# Patient Record
Sex: Female | Born: 1990 | Race: Black or African American | Hispanic: No | Marital: Single | Smoking: Current every day smoker
Health system: Southern US, Community
[De-identification: ages and names within clinical notes are randomized; demographics above are authoritative.]

## PROBLEM LIST (undated history)

## (undated) DIAGNOSIS — G43909 Migraine, unspecified, not intractable, without status migrainosus: Secondary | ICD-10-CM

## (undated) DIAGNOSIS — R569 Unspecified convulsions: Secondary | ICD-10-CM

## (undated) DIAGNOSIS — R51 Headache: Secondary | ICD-10-CM

## (undated) DIAGNOSIS — J45909 Unspecified asthma, uncomplicated: Secondary | ICD-10-CM

## (undated) DIAGNOSIS — H539 Unspecified visual disturbance: Secondary | ICD-10-CM

## (undated) DIAGNOSIS — L509 Urticaria, unspecified: Secondary | ICD-10-CM

## (undated) DIAGNOSIS — I639 Cerebral infarction, unspecified: Secondary | ICD-10-CM

## (undated) DIAGNOSIS — Z8679 Personal history of other diseases of the circulatory system: Secondary | ICD-10-CM

## (undated) DIAGNOSIS — L309 Dermatitis, unspecified: Secondary | ICD-10-CM

## (undated) DIAGNOSIS — I671 Cerebral aneurysm, nonruptured: Secondary | ICD-10-CM

## (undated) DIAGNOSIS — R519 Headache, unspecified: Secondary | ICD-10-CM

## (undated) DIAGNOSIS — Z9889 Other specified postprocedural states: Secondary | ICD-10-CM

## (undated) HISTORY — PX: HERNIA REPAIR: SHX51

## (undated) HISTORY — DX: Unspecified visual disturbance: H53.9

## (undated) HISTORY — DX: Dermatitis, unspecified: L30.9

## (undated) HISTORY — DX: Cerebral infarction, unspecified: I63.9

## (undated) HISTORY — DX: Migraine, unspecified, not intractable, without status migrainosus: G43.909

## (undated) HISTORY — DX: Headache, unspecified: R51.9

## (undated) HISTORY — DX: Urticaria, unspecified: L50.9

## (undated) HISTORY — PX: CHOLECYSTECTOMY: SHX55

## (undated) HISTORY — DX: Unspecified asthma, uncomplicated: J45.909

## (undated) HISTORY — DX: Headache: R51

## (undated) HISTORY — DX: Unspecified convulsions: R56.9

## (undated) HISTORY — DX: Personal history of other diseases of the circulatory system: Z86.79

## (undated) HISTORY — DX: Other specified postprocedural states: Z98.890

## (undated) HISTORY — DX: Cerebral aneurysm, nonruptured: I67.1

---

## 2011-03-20 HISTORY — PX: TUBAL LIGATION: SHX77

## 2017-04-08 ENCOUNTER — Encounter: Payer: Self-pay | Admitting: Neurology

## 2017-04-08 ENCOUNTER — Telehealth (HOSPITAL_COMMUNITY): Payer: Self-pay | Admitting: Radiology

## 2017-04-08 ENCOUNTER — Encounter (INDEPENDENT_AMBULATORY_CARE_PROVIDER_SITE_OTHER): Payer: Self-pay

## 2017-04-08 ENCOUNTER — Ambulatory Visit: Payer: Medicaid Other | Admitting: Neurology

## 2017-04-08 VITALS — BP 117/77 | HR 61 | Ht 67.0 in | Wt 159.0 lb

## 2017-04-08 DIAGNOSIS — G43911 Migraine, unspecified, intractable, with status migrainosus: Secondary | ICD-10-CM | POA: Diagnosis not present

## 2017-04-08 DIAGNOSIS — G253 Myoclonus: Secondary | ICD-10-CM

## 2017-04-08 DIAGNOSIS — Z9889 Other specified postprocedural states: Secondary | ICD-10-CM

## 2017-04-08 DIAGNOSIS — Z8679 Personal history of other diseases of the circulatory system: Secondary | ICD-10-CM

## 2017-04-08 DIAGNOSIS — I671 Cerebral aneurysm, nonruptured: Secondary | ICD-10-CM | POA: Diagnosis not present

## 2017-04-08 DIAGNOSIS — G43909 Migraine, unspecified, not intractable, without status migrainosus: Secondary | ICD-10-CM

## 2017-04-08 HISTORY — DX: Migraine, unspecified, not intractable, without status migrainosus: G43.909

## 2017-04-08 HISTORY — DX: Personal history of other diseases of the circulatory system: Z98.890

## 2017-04-08 HISTORY — DX: Personal history of other diseases of the circulatory system: Z86.79

## 2017-04-08 MED ORDER — TOPIRAMATE 25 MG PO TABS: 50.0000 mg | ORAL_TABLET | Freq: Two times a day (BID) | ORAL | 3 refills | Status: DC

## 2017-04-08 MED ORDER — FOLIC ACID 1 MG PO TABS: 1.0000 mg | ORAL_TABLET | Freq: Every day | ORAL | 3 refills | Status: DC

## 2017-04-08 MED ORDER — RIZATRIPTAN BENZOATE 10 MG PO TABS: 10.0000 mg | ORAL_TABLET | Freq: Three times a day (TID) | ORAL | 3 refills | Status: AC | PRN

## 2017-04-08 NOTE — Patient Instructions (Signed)
   We will start Topamax for the headache.   We will get CT angiogram and get follow up with radiology, Dr. Corliss Skainseveshwar.  Topamax (topiramate) is a seizure medication that has an FDA approval for seizures and for migraine headache. Potential side effects of this medication include weight loss, cognitive slowing, tingling in the fingers and toes, and carbonated drinks will taste bad. If any significant side effects are noted on this drug, please contact our office.

## 2017-04-08 NOTE — Progress Notes (Signed)
Reason for visit: Headache, seizures  Referring physician: Dr. Knight  Leah Werner is a 27 y.o. female  History of present illness:  Ms. 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  This occurred in [place], at that time the aneurysm was coiled and the patient recovered fairly well.  She initially had some problems with balance with ambulation, but she has done quite well in the long run.  The patient went back for a cerebral angiogram 6 months after the aneurysm coiling, but she has not followed up otherwise.  The patient has moved to this area in April 2018.  The patient has had headaches since the cerebral aneurysm that may last 2-3 days at a time, she may have only 4 or 5 headache free days a month.  The headaches are in the frontal area, and may be associated with some nausea and occasional vomiting.  The patient indicates that sleeping will help the headache, she also takes Tylenol which does not help much.  The patient indicates that bright lights may bring on headaches.  The patient may have some cognitive clouding with the headaches.  Prior to the onset of the headache she gets occasional episodes of myoclonus affecting the legs, one side or the other.  The patient then may have rapid side to side eye movements.  These events last anywhere from 1 or 2 minutes to up to 7 minutes.  The headache will then follow.  The patient currently is not working, she is not on disability.  She does not operate a motor vehicle.  The patient in the past has been on Keppra, she indicates that it did help the jerking episodes.  The Keppra did not help the headache.  The patient has 7 brothers and 3 sisters, there is no family history of seizures or headache.  The patient reports that she did not have headaches prior to the cerebral aneurysm rupture.  She does not have any residual numbness or weakness of the face, arms, or legs.  She denies  any difficulty controlling the bowels or the bladder.  She currently is on no medications.  She comes into this office for an evaluation.  She just had blood work done on 04 April 2017, renal function was checked, she does not know the results of the blood work.  Past Medical History:  Diagnosis Date  . Cerebral aneurysm   . Headache   . History of cerebral aneurysm repair 04/08/2017  . Migraine headache 04/08/2017  . Seizures (HCC)   . Stroke (HCC)   . Vision abnormalities     Past Surgical History:  Procedure Laterality Date  . CHOLECYSTECTOMY    . HERNIA REPAIR    . TUBAL LIGATION  2013    Family History  Problem Relation Age of Onset  . Hypertension Mother   . Bipolar disorder Father   . Breast cancer Maternal Grandmother     Social history:  reports that she has been smoking cigarettes.  She has been smoking about 1.00 pack per day. she has never used smokeless tobacco. She reports that she drinks alcohol. She reports that she does not use drugs.  Medications:  Prior to Admission medications   Not on File      Allergies  Allergen Reactions  . Fruit & Vegetable Daily [Nutritional Supplements]     Fresh fruit and vegetable  . Latex     ROS:  Out  of a complete 14 system review of symptoms, the patient complains only of the following symptoms, and all other reviewed systems are negative.  Blurred vision, double vision Headache  Blood pressure 117/77, pulse 61, height 5\' 7"  (1.702 m), weight 159 lb (72.1 kg).  Physical Exam  General: The patient is alert and cooperative at the time of the examination.  Eyes: Pupils are equal, round, and reactive to light. Discs are flat bilaterally.  Neck: The neck is supple, no carotid bruits are noted.  Respiratory: The respiratory examination is clear.  Cardiovascular: The cardiovascular examination reveals a regular rate and rhythm, no obvious murmurs or rubs are noted.  Skin: Extremities are without significant  edema.  Neurologic Exam  Mental status: The patient is alert and oriented x 3 at the time of the examination. The patient has apparent normal recent and remote memory, with an apparently normal attention span and concentration ability.  Cranial nerves: Facial symmetry is present. There is good sensation of the face to pinprick and soft touch bilaterally. The strength of the facial muscles and the muscles to head turning and shoulder shrug are normal bilaterally. Speech is well enunciated, no aphasia or dysarthria is noted. Extraocular movements are full. Visual fields are full. The tongue is midline, and the patient has symmetric elevation of the soft palate. No obvious hearing deficits are noted.  Motor: The motor testing reveals 5 over 5 strength of all 4 extremities. Good symmetric motor tone is noted throughout.  Sensory: Sensory testing is intact to pinprick, soft touch, vibration sensation, and position sense on all 4 extremities. No evidence of extinction is noted.  Coordination: Cerebellar testing reveals good finger-nose-finger and heel-to-shin bilaterally.  Gait and station: Gait is normal. Tandem gait is normal. Romberg is negative. No drift is seen.  Reflexes: Deep tendon reflexes are symmetric, but are depressed bilaterally. Toes are downgoing bilaterally.    CT head 01/30/17:  IMPRESSION: No evidence of intracranial hemorrhage or other acute intracranial abnormality.  Vascular coils at the skullbase. If further evaluation of the brain and brain vasculature is desired, MRI/MRA may be considered.   Assessment/Plan:  1.  History of cerebral aneurysm, status post coiling procedure  2.  Headaches, probable migraine  3.  Episodes of myoclonus and horizontal nystagmus, questionable seizures  The patient has had headaches and episodes of myoclonus since the cerebral aneurysm rupture.  The patient will have the myoclonus and what sounds like horizontal nystagmus prior to  onset of the headache.  The headache has features of migraine.  The patient may not have seizures, she may have an aura to the migraine.  She only has for 5 days a month without headache.  She will be started on Topamax, 25 mg twice daily for 2 weeks and then go to 50 mg twice daily.  She will call for any dose adjustments.  Maxalt will be given for the headache.  The patient will be set up for a CT angiogram of the head.  The patient will be sent for consultation through Dr. Corliss Skains for follow-up following the cerebral aneurysm.  The patient will follow-up in 3 months.  The patient was also given a prescription for folic acid taking 1 mg daily.  She is not on any birth control.  Marlan Palau MD 04/08/2017 8:08 AM  [facility] [address] [place], [place] [address]  Phone [phone] Fax [phone]

## 2017-04-08 NOTE — Telephone Encounter (Signed)
Called pt, left VM for her to call to schedule visit with Deveshwar to follow past treated aneurysm. JM

## 2017-04-11 ENCOUNTER — Other Ambulatory Visit (HOSPITAL_COMMUNITY): Payer: Self-pay | Admitting: Interventional Radiology

## 2017-04-11 DIAGNOSIS — Z9889 Other specified postprocedural states: Secondary | ICD-10-CM

## 2017-04-17 ENCOUNTER — Ambulatory Visit (HOSPITAL_COMMUNITY): Admission: RE | Admit: 2017-04-17 | Payer: Self-pay | Source: Ambulatory Visit

## 2017-04-17 ENCOUNTER — Other Ambulatory Visit: Payer: Medicaid Other

## 2017-04-17 ENCOUNTER — Telehealth: Payer: Self-pay | Admitting: Neurology

## 2017-04-17 NOTE — Telephone Encounter (Signed)
Leah Werner called that she was at the office for her EEG but the office was closed   Her phone is [phone].  Please call her back to reschedule

## 2017-04-19 ENCOUNTER — Telehealth: Payer: Self-pay | Admitting: Neurology

## 2017-04-19 MED ORDER — NORTRIPTYLINE HCL 10 MG PO CAPS: ORAL_CAPSULE | ORAL | 3 refills | Status: DC

## 2017-04-19 NOTE — Telephone Encounter (Signed)
I called the patient.  The patient was having stomach cramps on the Topamax.  The primary doctor found that the pH of the urine was 8, but this is an expected change in the urine with any carbonic anhydrase inhibitor.  The medication will be discontinued as the patient could not tolerate the drug, not because of the changes in the urine.  We will start nortriptyline at night, the patient will contact our office if she has trouble tolerating this as well.

## 2017-04-19 NOTE — Telephone Encounter (Signed)
French Anaracy Knight-NP/[facility] [phone] called said the pt increased topiramate (TOPAMAX) 25 MG tablet to 50mg  yesterday as directed by Dr Anne HahnWillis. She felt "funny" and was having abdominal pain. She was seen at PCP office and NP did urine and PH was 8. She also did metabolic panel and advise the pt to hold off on topiramate (TOPAMAX) 25 MG tablet until further notice. She will fax labs and OV to [phone]. She is asking to please call the pt to advise today.

## 2017-04-24 ENCOUNTER — Ambulatory Visit (HOSPITAL_COMMUNITY)
Admission: RE | Admit: 2017-04-24 | Discharge: 2017-04-24 | Disposition: A | Discharge status: Home / Self Care | Payer: Medicaid Other | Source: Ambulatory Visit | Attending: Interventional Radiology | Admitting: Interventional Radiology

## 2017-04-24 ENCOUNTER — Other Ambulatory Visit (HOSPITAL_COMMUNITY): Payer: Self-pay | Admitting: Interventional Radiology

## 2017-04-24 DIAGNOSIS — Z9889 Other specified postprocedural states: Secondary | ICD-10-CM

## 2017-04-24 DIAGNOSIS — R51 Headache: Secondary | ICD-10-CM

## 2017-04-24 DIAGNOSIS — R519 Headache, unspecified: Secondary | ICD-10-CM

## 2017-04-24 HISTORY — PX: IR RADIOLOGIST EVAL & MGMT: IMG5224

## 2017-04-25 ENCOUNTER — Encounter (HOSPITAL_COMMUNITY): Payer: Self-pay | Admitting: Interventional Radiology

## 2017-04-25 ENCOUNTER — Telehealth: Payer: Self-pay | Admitting: Neurology

## 2017-04-25 ENCOUNTER — Ambulatory Visit (INDEPENDENT_AMBULATORY_CARE_PROVIDER_SITE_OTHER): Payer: Medicaid Other | Admitting: Neurology

## 2017-04-25 DIAGNOSIS — G253 Myoclonus: Secondary | ICD-10-CM

## 2017-04-25 DIAGNOSIS — R2990 Unspecified symptoms and signs involving the nervous system: Secondary | ICD-10-CM | POA: Diagnosis not present

## 2017-04-25 NOTE — Procedures (Signed)
    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  The aneurysm has been coiled.  The patient does have intermittent headaches, prior to the headaches she may get myoclonus affecting the legs, one side or the other.  The patient has been on Keppra previously and indicated that this did help the jerking episodes.  The patient is being evaluated for these events.  This is a routine EEG.  No skull defects are noted.  Medications include nortriptyline, folic acid and Maxalt.  EEG classification: Normal awake and asleep  Description of the recording: The background rhythms of this recording consists of a fairly well modulated medium amplitude background activity of 9 Hz. As the record progresses, the patient initially is in the waking state, but appears to enter the early stage II sleep during the recording, with rudimentary sleep spindles and vertex sharp wave activity seen. During the wakeful state, photic stimulation is performed, and this results in a bilateral and symmetric photic driving response. Hyperventilation was also performed, and this results in a minimal buildup of the background rhythm activities without significant slowing seen. At no time during the recording does there appear to be evidence of spike or spike wave discharges or evidence of focal slowing. EKG monitor shows no evidence of cardiac rhythm abnormalities with a heart rate of 66.  Impression: This is a normal EEG recording in the waking and sleeping state. No evidence of ictal or interictal discharges were seen at any time during the recording.

## 2017-04-25 NOTE — Telephone Encounter (Signed)
I called the patient.  EEG study done was unremarkable, no evidence of irritability seen.

## 2017-05-01 ENCOUNTER — Other Ambulatory Visit: Payer: Self-pay | Admitting: Radiology

## 2017-05-01 ENCOUNTER — Other Ambulatory Visit: Payer: Self-pay | Admitting: General Surgery

## 2017-05-02 ENCOUNTER — Ambulatory Visit (HOSPITAL_COMMUNITY): Admission: RE | Admit: 2017-05-02 | Payer: Medicaid Other | Source: Ambulatory Visit

## 2017-05-17 ENCOUNTER — Other Ambulatory Visit: Payer: Self-pay | Admitting: Radiology

## 2017-05-20 ENCOUNTER — Telehealth (HOSPITAL_COMMUNITY): Payer: Self-pay | Admitting: *Deleted

## 2017-05-20 ENCOUNTER — Other Ambulatory Visit: Payer: Self-pay | Admitting: Radiology

## 2017-05-20 NOTE — Telephone Encounter (Signed)
Left message on patients mothers phone to have patietn call us at [phone] as soon as possible

## 2017-05-20 NOTE — Telephone Encounter (Signed)
Called and left message on patients cell phone that we need to cancel his procedure for tomorrow.  We will call tomorrow to reschedule.  Left call back number for patient to call.

## 2017-05-21 ENCOUNTER — Ambulatory Visit (HOSPITAL_COMMUNITY): Admission: RE | Admit: 2017-05-21 | Payer: Medicaid Other | Source: Ambulatory Visit

## 2017-05-21 ENCOUNTER — Encounter (HOSPITAL_COMMUNITY): Payer: Self-pay

## 2017-06-03 ENCOUNTER — Other Ambulatory Visit: Payer: Self-pay | Admitting: Student

## 2017-06-04 ENCOUNTER — Other Ambulatory Visit (HOSPITAL_COMMUNITY): Payer: Self-pay | Admitting: Interventional Radiology

## 2017-06-04 ENCOUNTER — Encounter (HOSPITAL_COMMUNITY): Payer: Self-pay

## 2017-06-04 ENCOUNTER — Ambulatory Visit (HOSPITAL_COMMUNITY)
Admission: RE | Admit: 2017-06-04 | Discharge: 2017-06-04 | Disposition: A | Discharge status: Home / Self Care | Payer: Medicaid Other | Source: Ambulatory Visit | Attending: Interventional Radiology | Admitting: Interventional Radiology

## 2017-06-04 DIAGNOSIS — F17210 Nicotine dependence, cigarettes, uncomplicated: Secondary | ICD-10-CM | POA: Diagnosis not present

## 2017-06-04 DIAGNOSIS — R51 Headache: Secondary | ICD-10-CM | POA: Diagnosis present

## 2017-06-04 DIAGNOSIS — Z9889 Other specified postprocedural states: Secondary | ICD-10-CM

## 2017-06-04 DIAGNOSIS — Z91040 Latex allergy status: Secondary | ICD-10-CM | POA: Insufficient documentation

## 2017-06-04 DIAGNOSIS — Z8774 Personal history of (corrected) congenital malformations of heart and circulatory system: Secondary | ICD-10-CM | POA: Insufficient documentation

## 2017-06-04 DIAGNOSIS — R519 Headache, unspecified: Secondary | ICD-10-CM

## 2017-06-04 HISTORY — PX: IR ANGIO INTRA EXTRACRAN SEL COM CAROTID INNOMINATE BILAT MOD SED: IMG5360

## 2017-06-04 HISTORY — PX: IR ANGIO VERTEBRAL SEL VERTEBRAL BILAT MOD SED: IMG5369

## 2017-06-04 LAB — BASIC METABOLIC PANEL
ANION GAP: 12 (ref 5–15)
BUN: 11 mg/dL (ref 6–20)
CHLORIDE: 102 mmol/L (ref 101–111)
CO2: 23 mmol/L (ref 22–32)
Calcium: 9.3 mg/dL (ref 8.9–10.3)
Creatinine, Ser: 0.83 mg/dL (ref 0.44–1.00)
GFR calc Af Amer: >60 mL/min (ref >60)
Glucose, Bld: 92 mg/dL (ref 65–99)
Potassium: 3.3 mmol/L — ABNORMAL LOW (ref 3.5–5.1)
Sodium: 137 mmol/L (ref 135–145)

## 2017-06-04 LAB — CBC
HEMATOCRIT: 38.6 % (ref 36.0–46.0)
HEMOGLOBIN: 13 g/dL (ref 12.0–15.0)
MCH: 29.4 pg (ref 26.0–34.0)
MCHC: 33.7 g/dL (ref 30.0–36.0)
MCV: 87.3 fL (ref 78.0–100.0)
Platelets: 261 10*3/uL (ref 150–400)
RBC: 4.42 MIL/uL (ref 3.87–5.11)
RDW: 15 % (ref 11.5–15.5)
WBC: 7.8 10*3/uL (ref 4.0–10.5)

## 2017-06-04 LAB — PROTIME-INR
INR: 0.99
Prothrombin Time: 13 seconds (ref 11.4–15.2)

## 2017-06-04 LAB — PREGNANCY, URINE: Preg Test, Ur: NEGATIVE

## 2017-06-04 MED ORDER — ACETAMINOPHEN 325 MG PO TABS
650.0000 mg | ORAL_TABLET | Freq: Four times a day (QID) | ORAL | Status: DC | PRN
  Administered 2017-06-04: 650 mg via ORAL

## 2017-06-04 MED ORDER — FENTANYL CITRATE (PF) 100 MCG/2ML IJ SOLN
INTRAMUSCULAR | Status: AC | PRN
  Administered 2017-06-04 (×2): 25 ug via INTRAVENOUS

## 2017-06-04 MED ORDER — SODIUM CHLORIDE 0.9 % IV SOLN
INTRAVENOUS | Status: AC | PRN
  Administered 2017-06-04: 250 mL via INTRAVENOUS

## 2017-06-04 MED ORDER — SODIUM CHLORIDE 0.9 % IV SOLN: INTRAVENOUS | Status: DC

## 2017-06-04 MED ORDER — FENTANYL CITRATE (PF) 100 MCG/2ML IJ SOLN
INTRAMUSCULAR | Status: AC
  Filled 2017-06-04: qty 2

## 2017-06-04 MED ORDER — IOPAMIDOL (ISOVUE-300) INJECTION 61%
INTRAVENOUS | Status: AC
  Filled 2017-06-04: qty 50

## 2017-06-04 MED ORDER — LIDOCAINE HCL 1 % IJ SOLN
INTRAMUSCULAR | Status: AC
  Filled 2017-06-04: qty 20

## 2017-06-04 MED ORDER — LIDOCAINE HCL (PF) 1 % IJ SOLN
INTRAMUSCULAR | Status: AC | PRN
  Administered 2017-06-04: 10 mL

## 2017-06-04 MED ORDER — HEPARIN SODIUM (PORCINE) 1000 UNIT/ML IJ SOLN
INTRAMUSCULAR | Status: AC | PRN
  Administered 2017-06-04: 1000 [IU] via INTRAVENOUS

## 2017-06-04 MED ORDER — ACETAMINOPHEN 325 MG PO TABS
ORAL_TABLET | ORAL | Status: AC
  Administered 2017-06-04: 650 mg via ORAL
  Filled 2017-06-04: qty 2

## 2017-06-04 MED ORDER — SODIUM CHLORIDE 0.9 % IV SOLN
Freq: Once | INTRAVENOUS | Status: AC
  Administered 2017-06-04: 07:00:00 via INTRAVENOUS

## 2017-06-04 MED ORDER — HEPARIN SODIUM (PORCINE) 1000 UNIT/ML IJ SOLN
INTRAMUSCULAR | Status: AC
  Filled 2017-06-04: qty 2

## 2017-06-04 MED ORDER — IOPAMIDOL (ISOVUE-300) INJECTION 61%
INTRAVENOUS | Status: AC
  Administered 2017-06-04: 95 mL
  Filled 2017-06-04: qty 150

## 2017-06-04 MED ORDER — MIDAZOLAM HCL 2 MG/2ML IJ SOLN
INTRAMUSCULAR | Status: AC
  Filled 2017-06-04: qty 2

## 2017-06-04 MED ORDER — MIDAZOLAM HCL 2 MG/2ML IJ SOLN
INTRAMUSCULAR | Status: AC | PRN
  Administered 2017-06-04: 1 mg via INTRAVENOUS

## 2017-06-04 NOTE — Sedation Documentation (Signed)
Patient denies pain and is resting comfortably.  

## 2017-06-04 NOTE — Procedures (Signed)
S/P 4 vessel cerebral arteriogram. RT CFA approach  Findings. 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

## 2017-06-04 NOTE — Sedation Documentation (Signed)
Patient is resting comfortably. 

## 2017-06-04 NOTE — Sedation Documentation (Signed)
Pulses checked/right groin site assesed and verified with Shawna OrleansMelanie, RN in Short Stay.

## 2017-06-04 NOTE — Discharge Instructions (Signed)
Return to Work ___________________________________________________ was treated at our facility. 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  Employee may return to modified work on ______________________. Work activity restrictions Work activities that are not tolerated include: ___Bending ___Prolonged sitting ___Lifting more than ________ lb ___Squatting ___ Prolonged standing ___Climbing ___Reaching ___Pushing and pulling ___ Walking ___Other ______________________ These restrictions are effective until ______________________. Show this Return to Work statement to your supervisor at work as soon as possible. Your employer should be aware of your condition and can help with the necessary work activity restrictions. If you wish to return to work sooner than the date that is listed above, or if you have further problems that make it difficult for you to return at that time, please call our clinic or your health care provider. _________________________________________ Health Care Provider Name (printed) _________________________________________ Health Care Provider (signature) _________________________________________ Date This information is not intended to replace advice given to you by your health care provider. Make sure you discuss any questions you have with your health care provider. Document Released: 03/05/2005 Document Revised: 02/17/2016 Document Reviewed: 10/02/2013 Elsevier Interactive Patient Education  2018 Elsevier Inc. Moderate Conscious Sedation, Adult Sedation is the use of medicines to promote relaxation and relieve discomfort and anxiety. Moderate conscious sedation is a type of sedation. Under moderate conscious sedation, you are less alert than normal, but you are still able to respond to instructions, touch, or both. Moderate conscious  sedation is used during short medical and dental procedures. It is milder than deep sedation, which is a type of sedation under which you cannot be easily woken up. It is also milder than general anesthesia, which is the use of medicines to make you unconscious. Moderate conscious sedation allows you to return to your regular activities sooner. Tell a health care provider about:  Any allergies you have.  All medicines you are taking, including vitamins, herbs, eye drops, creams, and over-the-counter medicines.  Use of steroids (by mouth or creams).  Any problems you or family members have had with sedatives and anesthetic medicines.  Any blood disorders you have.  Any surgeries you have had.  Any medical conditions you have, such as sleep apnea.  Whether you are pregnant or may be pregnant.  Any use of cigarettes, alcohol, marijuana, or street drugs. What are the risks? Generally, this is a safe procedure. However, problems may occur, including:  Getting too much medicine (oversedation).  Nausea.  Allergic reaction to medicines.  Trouble breathing. If this happens, a breathing tube may be used to help with breathing. It will be removed when you are awake and breathing on your own.  Heart trouble.  Lung trouble.  What happens before the procedure? Staying hydrated Follow instructions from your health care provider about hydration, which may include:  Up to 2 hours before the procedure - you may continue to drink clear liquids, such as water, clear fruit juice, black coffee, and plain tea.  Eating and drinking restrictions Follow instructions from your health care provider about eating and drinking, which may include:  8 hours before the procedure - stop eating heavy meals or foods such as meat, fried foods, or fatty foods.  6 hours before the procedure - stop eating light meals or foods, such as toast or cereal.  6 hours before the procedure - stop drinking milk or  drinks that contain milk.  2 hours before the procedure -  stop drinking clear liquids.  Medicine  Ask your health care provider about:  Changing or stopping your regular medicines. This is especially important if you are taking diabetes medicines or blood thinners.  Taking medicines such as aspirin and ibuprofen. These medicines can thin your blood. Do not take these medicines before your procedure if your health care provider instructs you not to.  Tests and exams  You will have a physical exam.  You may have blood tests done to show: ? How well your kidneys and liver are working. ? How well your blood can clot. General instructions  Plan to have someone take you home from the hospital or clinic.  If you will be going home right after the procedure, plan to have someone with you for 24 hours. What happens during the procedure?  An IV tube will be inserted into one of your veins.  Medicine to help you relax (sedative) will be given through the IV tube.  The medical or dental procedure will be performed. What happens after the procedure?  Your blood pressure, heart rate, breathing rate, and blood oxygen level will be monitored often until the medicines you were given have worn off.  Do not drive for 24 hours. This information is not intended to replace advice given to you by your health care provider. Make sure you discuss any questions you have with your health care provider. Document Released: 11/28/2000 Document Revised: 08/09/2015 Document Reviewed: 06/25/2015 Elsevier Interactive Patient Education  2018 Elsevier Inc. Cerebral Angiogram, Care After Refer to this sheet in the next few weeks. These instructions provide you with information on caring for yourself after your procedure. Your health care provider may also give you more specific instructions. Your treatment has been planned according to current medical practices, but problems sometimes occur. Call your health  care provider if you have any problems or questions after your procedure. What can I expect after the procedure? After your procedure, it is typical to have the following:  Bruising at the catheter insertion site that usually fades within 1-2 weeks.  Blood collecting in the tissue (hematoma) that may be painful to the touch. It should usually decrease in size and tenderness within 1-2 weeks.  A mild headache.  Follow these instructions at home:  Take medicines only as directed by your health care provider.  You may shower 24-48 hours after the procedure or as directed by your health care provider. Remove the bandage (dressing) and gently wash the site with plain soap and water. Pat the area dry with a clean towel. Do not rub the site, because this may cause bleeding.  Do not take baths, swim, or use a hot tub until your health care provider approves.  Check your insertion site every day for redness, swelling, or drainage.  Do not apply powder or lotion to the site.  Do not lift over 10 lb (4.5 kg) for 5 days after your procedure or as directed by your health care provider.  Ask your health care provider when it is okay to: ? Return to work or school. ? Resume usual physical activities or sports. ? Resume sexual activity.  Do not drive home if you are discharged the same day as the procedure. Have someone else drive you.  You may drive 24 hours after the procedure unless otherwise instructed by your health care provider.  Do not operate machinery or power tools for 24 hours after the procedure or as directed by your health care provider.  If your procedure was done as an outpatient procedure, which means that you went home the same day as your procedure, a responsible adult should be with you for the first 24 hours after you arrive home.  Keep all follow-up visits as directed by your health care provider. This is important. Contact a health care provider if:  You have a  fever.  You have chills.  You have increased bleeding from the catheter insertion site. Hold pressure on the site. Get help right away if:  You have vision changes or loss of vision.  You have numbness or weakness on one side of your body.  You have difficulty talking, or you have slurred speech or cannot speak (aphasia).  You feel confused or have difficulty remembering.  You have unusual pain at the catheter insertion site.  You have redness, warmth, or swelling at the catheter insertion site.  You have drainage (other than a small amount of blood on the dressing) from the catheter insertion site.  The catheter insertion site is bleeding, and the bleeding does not stop after 30 minutes of holding steady pressure on the site. These symptoms may represent a serious problem that is an emergency. Do not wait to see if the symptoms will go away. Get medical help right away. Call your local emergency services (911 in [place] Do not drive yourself to the hospital. This information is not intended to replace advice given to you by your health care provider. Make sure you discuss any questions you have with your health care provider. Document Released: 07/20/2013 Document Revised: 08/11/2015 Document Reviewed: 03/18/2013 Elsevier Interactive Patient Education  2017 [employer]

## 2017-06-04 NOTE — Progress Notes (Signed)
Client c/o right groin pain 8/10 and Kasey,PA notified and in to see client

## 2017-06-04 NOTE — Progress Notes (Signed)
Per Daphene JaegerKasey,PA will give tylenol as ordered and apply warm compress to right groin per PA [facility]

## 2017-06-04 NOTE — H&P (Signed)
Chief Complaint: Patient was seen in consultation today for recurrent headaches.  Referring Physician(s): None.  Supervising Physician: Julieanne Cotton  Patient Status: [facility] - Out-pt  History of Present Illness: Leah Werner is a 27 y.o. 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 Coiled in [place], patient recovered well. No follow up after 6 month angiogram, as patient then moved to [place].  Hx headaches since cerebral aneurysm rupture. Located in frontal area, lasting up to 2-3 days, associated with nausea and occasional vomiting, aggravated by bright lights, alleviated by sleeping. Denies numbness, tingling, or weakness.  Presents today with a headache. She was last seen in Dr. Fatima Sanger clinic 04/24/17. IR requested for image-guided cerebral angiogram to evaluate stability of previously treated aneurysm and to assess for the presence of other aneurysms.  Past Medical History:  Diagnosis Date  . Cerebral aneurysm   . Headache   . History of cerebral aneurysm repair 04/08/2017  . Migraine headache 04/08/2017  . Seizures (HCC)   . Stroke (HCC)   . Vision abnormalities     Past Surgical History:  Procedure Laterality Date  . CHOLECYSTECTOMY    . HERNIA REPAIR    . IR RADIOLOGIST EVAL & MGMT  04/24/2017  . TUBAL LIGATION  2013    Allergies: Fruit & vegetable daily [nutritional supplements]; Latex; and Topamax [topiramate]  Medications: Prior to Admission medications   Medication Sig Start Date End Date Taking? Authorizing Provider  folic acid (FOLVITE) 1 MG tablet Take 1 tablet (1 mg total) by mouth daily. 04/08/17  Yes York Spaniel, MD  nortriptyline (PAMELOR) 10 MG capsule Take one capsule at night for one week, then take 2 capsules at night for one week, then take 3 capsules at night 04/19/17  Yes York Spaniel, MD  rizatriptan (MAXALT) 10 MG tablet Take 1 tablet (10 mg total) by mouth 3 (three) times daily  as needed for migraine. 04/08/17  Yes York Spaniel, MD     Family History  Problem Relation Age of Onset  . Hypertension Mother   . Bipolar disorder Father   . Breast cancer Maternal Grandmother     Social History   Socioeconomic History  . Marital status: Single    Spouse name: None  . Number of children: None  . Years of education: None  . Highest education level: None  Social Needs  . Financial resource strain: None  . Food insecurity - worry: None  . Food insecurity - inability: None  . Transportation needs - medical: None  . Transportation needs - non-medical: None  Occupational History  . None  Tobacco Use  . Smoking status: Current Every Day Smoker    Packs/day: 1.00    Types: Cigarettes  . Smokeless tobacco: Never Used  Substance and Sexual Activity  . Alcohol use: Yes    Comment: occasionally  . Drug use: No  . Sexual activity: None  Other Topics Concern  . None  Social History Narrative  . None     Review of Systems: A 12 point ROS discussed and pertinent positives are indicated in the HPI above.  All other systems are negative.  Review of Systems  Constitutional: Negative for activity change, appetite change and fever.  Respiratory: Negative for shortness of breath and wheezing.   Cardiovascular: Negative for chest pain and palpitations.  Neurological: Positive for headaches. Negative for dizziness, weakness and numbness.  Psychiatric/Behavioral: Negative for behavioral problems, confusion and decreased concentration.  Vital Signs: BP 106/69 (BP Location: Left Arm)   Pulse 67   Temp 98.1 F (36.7 C) (Oral)   Resp 16   Ht 5\' 7"  (1.702 m)   Wt 153 lb (69.4 kg)   LMP 05/27/2017   SpO2 100%   BMI 23.96 kg/m   Physical Exam  Constitutional: She is oriented to person, place, and time. She appears well-developed and well-nourished. No distress.  Cardiovascular: Normal rate, regular rhythm, normal heart sounds and intact distal pulses.  No  murmur heard. Pulmonary/Chest: Effort normal and breath sounds normal. She has no wheezes.  Neurological: She is alert and oriented to person, place, and time.  Headache without associated numbness or weakness.  Skin: Skin is warm and dry.  Psychiatric: She has a normal mood and affect. Her behavior is normal. Judgment and thought content normal.  Nursing note and vitals reviewed.    MD Evaluation Airway: WNL Heart: WNL Abdomen: WNL Chest/ Lungs: WNL ASA  Classification: 3 Mallampati/Airway Score: One   Imaging: No results found.  Labs:  CBC: Recent Labs    06/04/17 0700  WBC 7.8  HGB 13.0  HCT 38.6  PLT 261    COAGS: Recent Labs    06/04/17 0700  INR 0.99    BMP: Recent Labs    06/04/17 0700  NA 137  K 3.3*  CL 102  CO2 23  GLUCOSE 92  BUN 11  CALCIUM 9.3  CREATININE 0.83  GFRNONAA >60  GFRAA >60    LIVER FUNCTION TESTS: No results for input(s): BILITOT, AST, ALT, ALKPHOS, PROT, ALBUMIN in the last 8760 hours.  TUMOR MARKERS: No results for input(s): AFPTM, CEA, CA199, CHROMGRNA in the last 8760 hours.  Assessment and Plan:  Recurrent headaches since cerebral aneurysm rupture in 2015.  Plan for image-guided cerebral angiogram to evaluate stability of previously treated aneurysm and to assess for the presence of other aneurysms. Patient is NPO. Patient is not on blood thinners.  Risks and benefits of cerebral angiogram were discussed with the patient including, but not limited to bleeding, infection, vascular injury or contrast induced renal failure. This interventional procedure involves the use of X-rays and because of the nature of the planned procedure, it is possible that we will have prolonged use of X-ray fluoroscopy.  Potential radiation risks to you include (but are not limited to) the following: - A slightly elevated risk for cancer  several years later in life. This risk is typically less than 0.5% percent. This risk is low in  comparison to the normal incidence of human cancer, which is 33% for women and 50% for men according to the [employer]. - Radiation induced injury can include skin redness, resembling a rash, tissue breakdown / ulcers and hair loss (which can be temporary or permanent).   The likelihood of either of these occurring depends on the difficulty of the procedure and whether you are sensitive to radiation due to previous procedures, disease, or genetic conditions.  IF your procedure requires a prolonged use of radiation, you will be notified and given written instructions for further action.  It is your responsibility to monitor the irradiated area for the 2 weeks following the procedure and to notify your physician if you are concerned that you have suffered a radiation induced injury.    All of the patient's questions were answered, patient is agreeable to proceed. Consent signed and in chart.  Thank you for this interesting consult.  I greatly enjoyed meeting Leah Werner  and look forward to participating in their care.  A copy of this report was sent to the requesting provider on this date.  Electronically Signed: Elwin MochaAlexandra Charnell Peplinski, PA-C 06/04/2017, 7:53 AM   I spent a total of  30 minutes in face to face in clinical consultation, greater than 50% of which was counseling/coordinating care for recurrent headaches.

## 2017-06-04 NOTE — Sedation Documentation (Signed)
Sheath removed from right groin by Dr. Corliss Skainseveshwar. 685ft exoseal placed in right groin.

## 2017-06-04 NOTE — Sedation Documentation (Addendum)
O2/EtCO2  removed per Dr. Corliss SkainsEVESHWAR.

## 2017-06-05 ENCOUNTER — Encounter (HOSPITAL_COMMUNITY): Payer: Self-pay | Admitting: Interventional Radiology

## 2017-06-06 ENCOUNTER — Other Ambulatory Visit: Payer: Medicaid Other

## 2017-07-09 ENCOUNTER — Encounter: Payer: Self-pay | Admitting: Adult Health

## 2017-07-09 ENCOUNTER — Telehealth: Payer: Self-pay | Admitting: Neurology

## 2017-07-09 ENCOUNTER — Ambulatory Visit: Payer: Medicaid Other | Admitting: Adult Health

## 2017-07-09 NOTE — Telephone Encounter (Signed)
Pt was no show to apt today 

## 2017-08-17 ENCOUNTER — Other Ambulatory Visit: Payer: Self-pay

## 2017-08-17 ENCOUNTER — Encounter (HOSPITAL_BASED_OUTPATIENT_CLINIC_OR_DEPARTMENT_OTHER): Payer: Self-pay | Admitting: Emergency Medicine

## 2017-08-17 DIAGNOSIS — M25569 Pain in unspecified knee: Secondary | ICD-10-CM | POA: Insufficient documentation

## 2017-08-17 DIAGNOSIS — Z5321 Procedure and treatment not carried out due to patient leaving prior to being seen by health care provider: Secondary | ICD-10-CM | POA: Insufficient documentation

## 2017-08-17 NOTE — ED Triage Notes (Signed)
Patient states that she woke up this am with swelling to her knee - she reports that it is now painful from her right hip all the way down her right leg

## 2017-08-18 ENCOUNTER — Emergency Department (HOSPITAL_BASED_OUTPATIENT_CLINIC_OR_DEPARTMENT_OTHER)
Admission: EM | Admit: 2017-08-18 | Discharge: 2017-08-18 | Disposition: A | Discharge status: Home / Self Care | Payer: Medicaid Other | Attending: Emergency Medicine | Admitting: Emergency Medicine

## 2017-08-18 NOTE — ED Notes (Signed)
No answer when name called.

## 2017-08-19 NOTE — ED Notes (Signed)
Follow up call made  No answer  1135  08/19/17  s Mirakle Tomlin rn

## 2019-03-27 IMAGING — XA IR ANGIO VETEBRAL SEL VERTEBRAL BILAT MOD SED
1 series · 12 of 24 positions shown · IV contrast (IODINE)
Comparison: CT scan of the brain of 01/30/2017.

CLINICAL DATA: Persistent headaches generalized. Previous history
of ruptured anterior communicating artery region aneurysm 4 years
ago. Treated endovascularly.

EXAM:
BILATERAL COMMON CAROTID AND INNOMINATE ANGIOGRAPHY; IR ANGIO
VERTEBRAL SEL VERTEBRAL BILAT MOD SED
TECHNIQUE: Informed written consent was obtained from the patient after a
thorough discussion of the procedural risks, benefits and
alternatives. All questions were addressed. Maximal Sterile Barrier
Technique was utilized including caps, mask, sterile gowns, sterile
gloves, sterile drape, hand hygiene and skin antiseptic. A timeout
was performed prior to the initiation of the procedure.

[Series 300: dr. (person_name) · 12 of 153 slices shown]
[im 7/153]
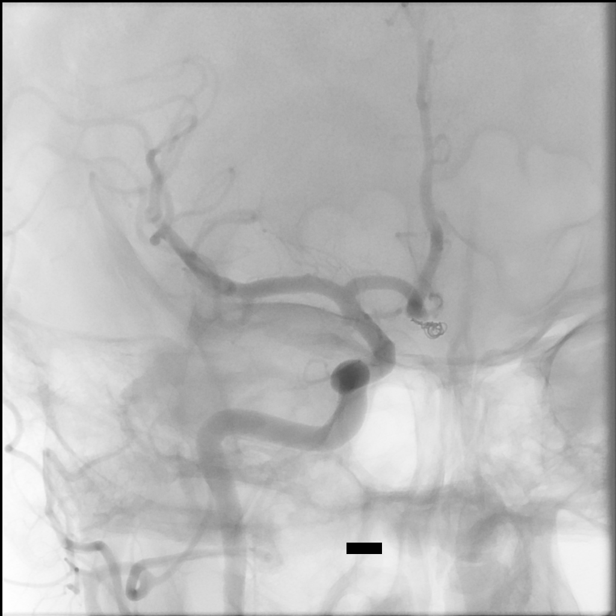
[im 20/153]
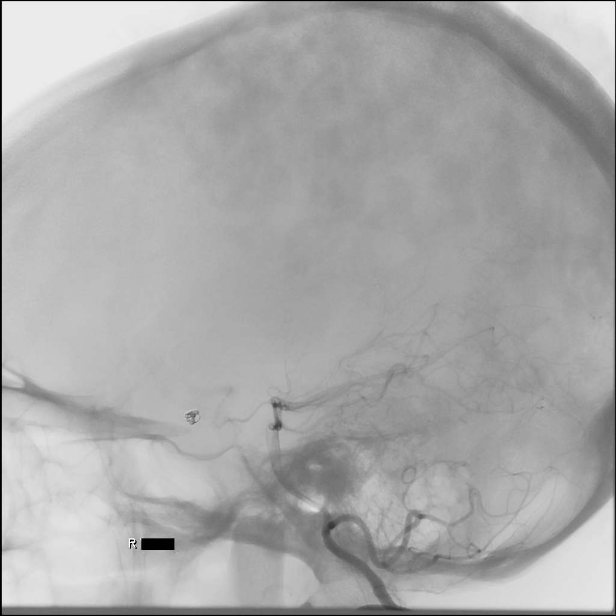
[im 34/153]
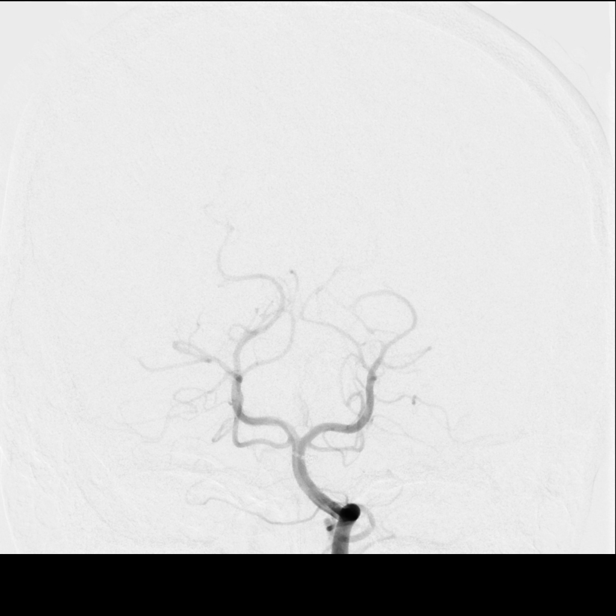
[im 47/153]
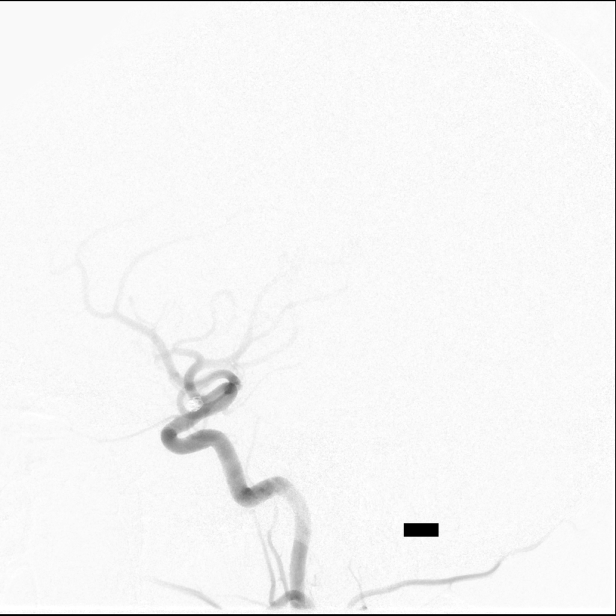
[im 60/153]
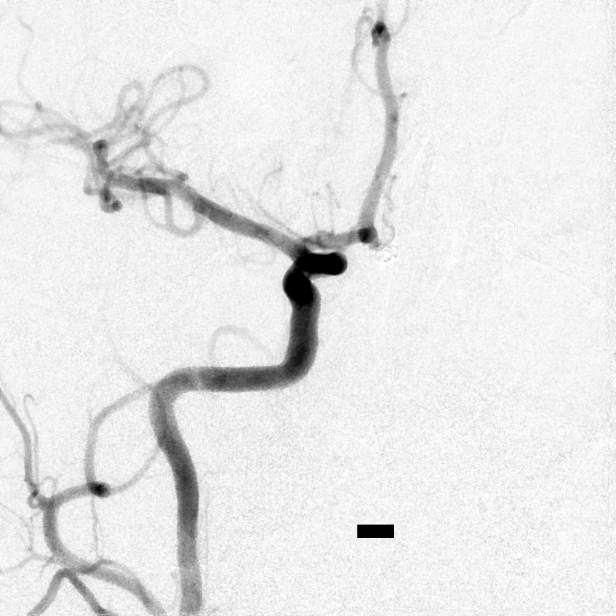
[im 73/153]
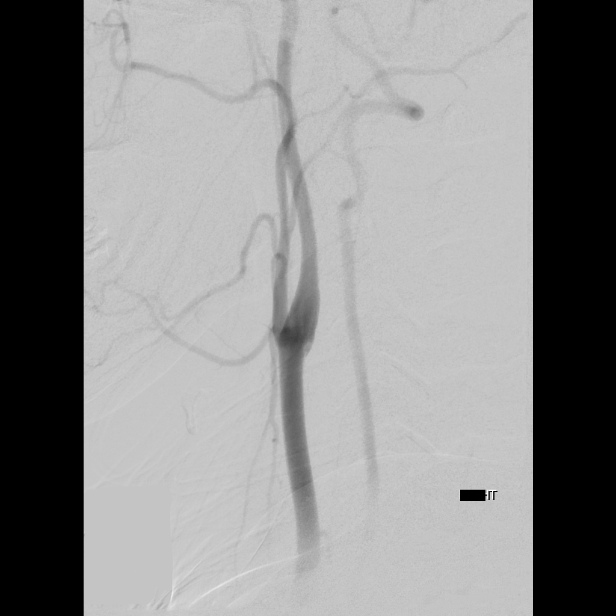
[im 86/153]
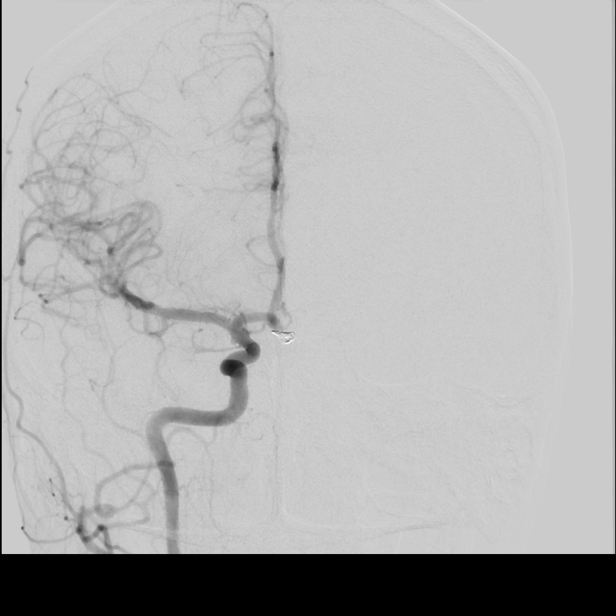
[im 100/153]
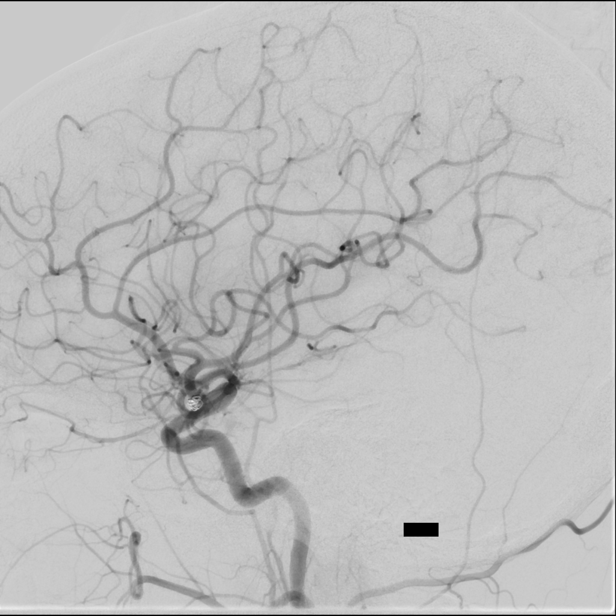
[im 113/153]
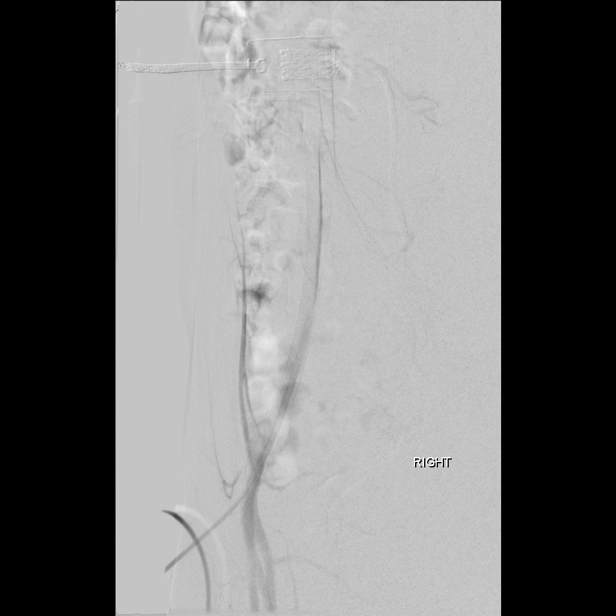
[im 126/153]
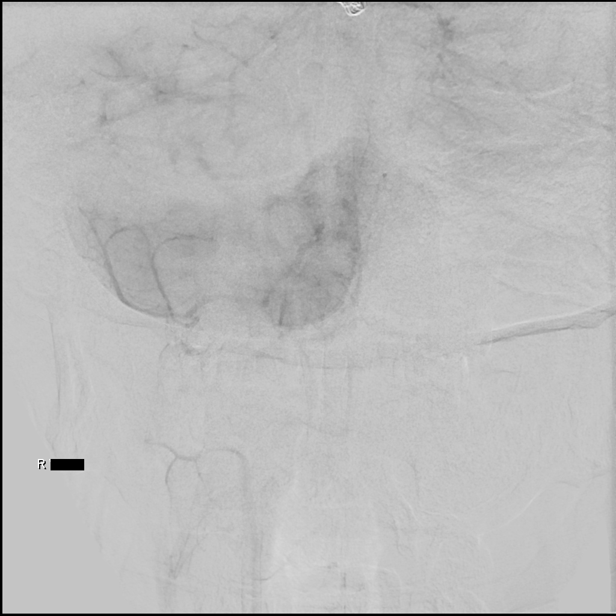
[im 139/153]
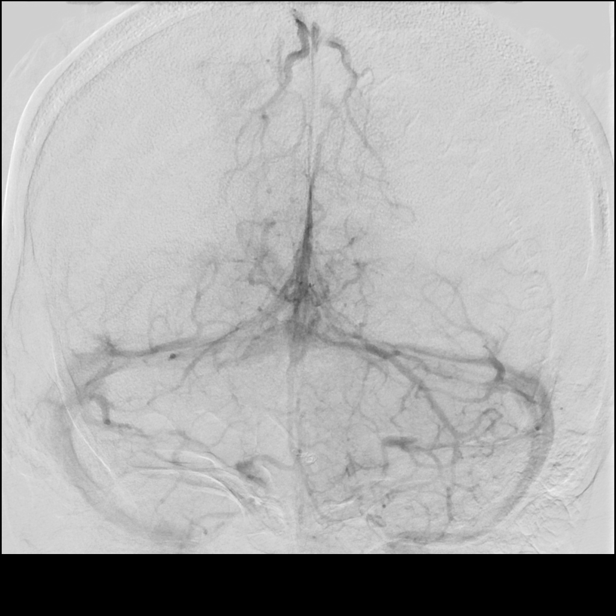
[im 153/153]
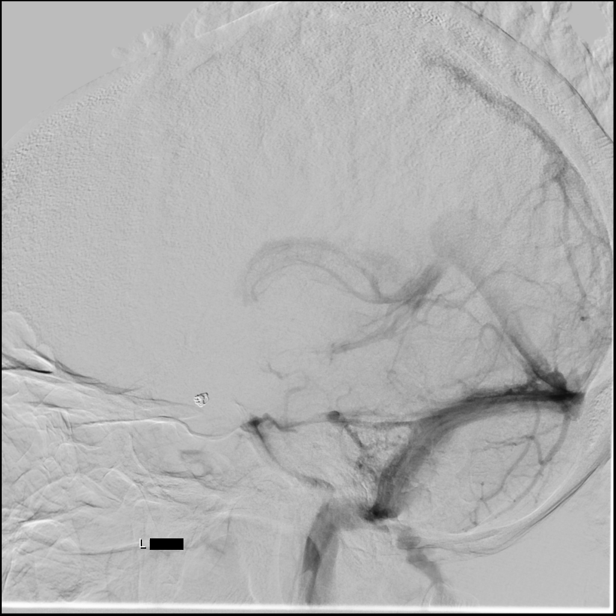

[12 of 24 positions shown; findings below may reference images not displayed]

MEDICATIONS:
Heparin 6999 units IV; no antibiotic was administered within 1 hour
of the procedure.

ANESTHESIA/SEDATION:
Versed 1 mg IV; Fentanyl 50 mcg IV.

Moderate Sedation Time:  36 minutes.

The patient was continuously monitored during the procedure by the
interventional radiology nurse under my direct supervision.

CONTRAST:  Isovue 300 approximately 60 mL.

FLUOROSCOPY TIME:  Fluoroscopy Time: 6 minutes 43 seconds (713 mGy).

COMPLICATIONS:
None immediate.
The right groin was prepped and draped in the usual sterile fashion.
Thereafter using modified Seldinger technique, transfemoral access
into the right common femoral artery was obtained without
difficulty. Over a 0.035 inch guidewire, a 5 French Pinnacle sheath
was inserted. Through this, and also over 0.035 inch guidewire, a 5
French JB 1 catheter was advanced to the aortic arch region and
selectively positioned in the right common carotid artery, , the
right vertebral artery, the left common carotid artery and the left
vertebral artery.
FINDINGS: The right common carotid arteriogram demonstrates the right external
carotid artery and its major branches to be widely patent.

The right internal carotid artery at the bulb to the cranial skull
base is widely patent.

The petrous, cavernous and the supraclinoid segments demonstrate
wide patency.

The right middle cerebral artery and the right anterior cerebral
artery opacify normally into the capillary and venous phases.

There is transient opacification via the anterior communicating
artery of the left anterior cerebral artery A2 segment.

In the anterior communicating artery region, there is a pack of
coils which appear to have undergone compaction.

Associated with this is an approximately 1.5 mm neck remnant of the
previously treated anterior communicating artery region aneurysm.

The origin of the right vertebral artery is normal. The vessel is
seen to opacify normally to the cranial skull base. There is normal
opacification of the right vertebrobasilar junction and the right
posterior-inferior cerebellar artery.

More distally the right vertebrobasilar junction opacifies normally.
Opacification of the basilar artery, the posterior cerebral
arteries, the superior cerebellar arteries and faintly of the
anterior-inferior cerebellar artery is noted. Non-opacified blood is
seen in the basilar artery from the contralateral vertebral artery.

The left common carotid arteriogram demonstrates the left external
carotid artery and its major branches to be widely patent.

The left internal carotid artery at the bulb to the cranial skull
base opacifies normally. The petrous, cavernous and supraclinoid
segments are widely patent.

The left middle cerebral artery and the left anterior cerebral
artery opacify normally into the capillary and venous phases.

The left vertebral artery origin is widely patent.

The vessel is seen to opacify normally to the cranial skull base.
Normal opacification is seen of the left vertebrobasilar junction
and the left posterior inferior cerebellar artery. The basilar
artery, the posterior cerebral arteries, the superior cerebellar
arteries and the anterior-inferior cerebellar arteries opacify
normally into the capillary and venous phases. Non-opacified blood
is seen in the proximal basilar artery from the contralateral
vertebral artery.
IMPRESSION: Approximately 1.5 mm neck remnant of the previously endovascularly
treated anterior communicating artery aneurysm with evidence of coil
compaction.

No other aneurysms identified at this time.

PLAN:
Angiographic findings were reviewed with the patient and her close
male friend.

Brought to her attention was the partially recanalized neck of the
previously treated aneurysm.

The patient was again strongly advised to stop smoking cigarettes
and as this would likely promote the worsening of the neck remnant
portion of the aneurysm with potential for regrowth. The patient
expressed understanding and wanting to stop smoking. In the
meantime, a follow-up CT angiogram will be undertaken in 6 months to
a years time. Should she developed worsening headaches, she was
advised to call promptly. The patient and her friend leave with good
understanding and agreement with the above management plan.

## 2021-02-08 ENCOUNTER — Encounter: Payer: Self-pay | Admitting: Allergy

## 2021-02-08 ENCOUNTER — Ambulatory Visit (INDEPENDENT_AMBULATORY_CARE_PROVIDER_SITE_OTHER): Payer: Commercial Managed Care - PPO | Admitting: Allergy

## 2021-02-08 ENCOUNTER — Other Ambulatory Visit: Payer: Self-pay

## 2021-02-08 VITALS — BP 110/84 | HR 56 | Temp 97.7°F | Resp 16 | Ht 66.54 in | Wt 160.2 lb

## 2021-02-08 DIAGNOSIS — H1013 Acute atopic conjunctivitis, bilateral: Secondary | ICD-10-CM | POA: Diagnosis not present

## 2021-02-08 DIAGNOSIS — J3089 Other allergic rhinitis: Secondary | ICD-10-CM | POA: Diagnosis not present

## 2021-02-08 DIAGNOSIS — T7840XA Allergy, unspecified, initial encounter: Secondary | ICD-10-CM | POA: Diagnosis not present

## 2021-02-08 DIAGNOSIS — T781XXD Other adverse food reactions, not elsewhere classified, subsequent encounter: Secondary | ICD-10-CM

## 2021-02-08 DIAGNOSIS — T7800XA Anaphylactic reaction due to unspecified food, initial encounter: Secondary | ICD-10-CM

## 2021-02-08 DIAGNOSIS — L2089 Other atopic dermatitis: Secondary | ICD-10-CM | POA: Diagnosis not present

## 2021-02-08 DIAGNOSIS — J4540 Moderate persistent asthma, uncomplicated: Secondary | ICD-10-CM | POA: Diagnosis not present

## 2021-02-08 MED ORDER — OLOPATADINE HCL 0.2 % OP SOLN: OPHTHALMIC | 5 refills | Status: AC

## 2021-02-08 MED ORDER — EPINEPHRINE 0.3 MG/0.3ML IJ SOAJ: INTRAMUSCULAR | 1 refills | Status: AC

## 2021-02-08 MED ORDER — RYALTRIS 665-25 MCG/ACT NA SUSP: 2.0000 | Freq: Two times a day (BID) | NASAL | 5 refills | Status: AC

## 2021-02-08 MED ORDER — ALBUTEROL SULFATE (2.5 MG/3ML) 0.083% IN NEBU: 2.5000 mg | INHALATION_SOLUTION | Freq: Four times a day (QID) | RESPIRATORY_TRACT | 1 refills | Status: AC | PRN

## 2021-02-08 MED ORDER — PIMECROLIMUS 1 % EX CREA: TOPICAL_CREAM | CUTANEOUS | 3 refills | Status: AC

## 2021-02-08 NOTE — Progress Notes (Signed)
New Patient Note  RE: Leah Werner MRN: [id number] DOB: October 06, 1990 Date of Office Visit: 02/08/2021  Primary care provider: Osa Craver, NP  Chief Complaint: allergies  History of present illness: Leah Werner is a 30 y.o. female presenting today for evaluation of allergic rhinitis, asthma, eczema.   She reports itchy/watery eyes, nasal congestion/drainage and sneezing are year-round.  There is a cat and dog in the home.   She is using xyzal nightly since Aug 2022.  She can't tell a difference in symptoms with use of Xyzal.  She takes hydroxyzine nightly (and sometimes during the day) for anxiety and does feel it helps with itching.  She reports taking xyzal, hydroxyzine and benadryl at night as her most recent regimen.  She states when she has used flonase it make her nose feel more swollen thus she stopped use.   Her PCP did serum IgE testing for environmental allergies and many allergens were positive (see results below).    She states since childhood she has not been able to eat fresh fruit/veggies without developing facial swelling, oral itching, hives.    She has noted this with strawberry, banana, grapes, citrus fruits, cucumber, lettuce, broccoli, carrots (most vegetable really).  She states in order to tolerate these vegetables they have to be cooked to basically mushy consistent.   Her PCP also did a food allergy panel and several foods were positive (see below).  Of the positive foods she reports after ingestion: Peanut, tree nuts - will notice throat tightening sensation and sore throat Soybean - will notice throat tightening sensation.  With edamame has noticed hives. Bread/wheat - generalized itching and dark itchy rash (worsening eczema) Corn, sesame - itchy throat, runny nose, hives Has never had any symptom with shellfish. She does not have an epinephrine device.    She has had asthma since childhood.  She states when the weather is very cold or really hot it can  trigger symptoms of shortness of breath. She states when she gets sick it also flares her asthma. She has an albuterol inhaler and takes it in the mornings now that it cooler for prevention of symptoms.  She used to have a nebulizer but no longer has a machine.  She was on Advair discus about 10 years ago.   She has not had an maintenance inhaler in that time frame.  No hospitalization.    She has history of eczema.  She has used HC, mometasone, triamcinolone, clobetesol in the past and doesn't really feel like any of these were that helpful in managing her flares.  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    Review of systems in the past 4 weeks: Review of Systems  Constitutional: Negative.   HENT:  Positive for congestion.        See HPI  Eyes:        See HPI  Respiratory:  Positive for shortness of breath.   Cardiovascular: Negative.   Gastrointestinal: Negative.   Musculoskeletal: Negative.   Skin:  Positive for itching and rash.  Neurological: Negative.    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   Migraine headache 04/08/2017  Seizures (HCC)    Stroke ([place])    Urticaria    Vision abnormalities     Past surgical history: Past Surgical History:  Procedure Laterality Date   CHOLECYSTECTOMY     HERNIA REPAIR     IR ANGIO INTRA EXTRACRAN SEL COM CAROTID INNOMINATE BILAT MOD SED  06/04/2017   IR ANGIO VERTEBRAL SEL VERTEBRAL BILAT MOD SED  06/04/2017   IR RADIOLOGIST EVAL & MGMT  04/24/2017   TUBAL LIGATION  2013    Family history:  Family History  Problem Relation Age of Onset   Allergic rhinitis Mother    Hypertension Mother    Asthma Father    Bipolar disorder Father     Allergic rhinitis Maternal Grandmother    Breast cancer Maternal Grandmother    Asthma Son    Asthma Daughter    Angioedema Neg Hx    Eczema Neg Hx    Immunodeficiency Neg Hx    Urticaria Neg Hx     Social history: Lives in a home without carpeting with gas heating and central cooling.  Dog in the home.  There is no concern for water damage, mildew or roaches in the home.  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  She is a smoking history of cigarettes half pack per day.   Medication List: Current Outpatient Medications  Medication Sig Dispense Refill   albuterol (VENTOLIN HFA) 108 (90 Base) MCG/ACT inhaler Inhale 2 puffs into the lungs every 6 (six) hours as needed.     Clindamycin-Benzoyl Per, Refr, gel Apply topically as needed.     diphenhydrAMINE (SOMINEX) 25 MG tablet Take by mouth.     hydrOXYzine (ATARAX/VISTARIL) 25 MG tablet Take 25 mg by mouth as needed.     levocetirizine (XYZAL) 5 MG tablet Take 5 mg by mouth daily as needed.     rizatriptan (MAXALT) 10 MG tablet Take 1 tablet (10 mg total) by mouth 3 (three) times daily as needed for migraine. 10 tablet 3   No current facility-administered medications for this visit.    Known medication allergies: Allergies  Allergen Reactions   Nutritional Supplements Other (See Comments) and Rash    Fresh fruit and vegetable Fresh fruit and vegetable Fresh fruit and vegetable    Fruit & Vegetable Daily [Nutritional Supplements]     Fresh fruit and vegetable   Latex    Topamax [Topiramate]     Stomach cramps     Physical examination: Blood pressure 110/84, pulse (!) 56, temperature 97.7 F (36.5 C), temperature source Temporal, resp. rate 16, height 5' 6.54" (1.69 m), weight 160 lb 3.2 oz (72.7 kg), SpO2 100 %.  General: Alert, interactive, in no acute distress. HEENT: PERRLA, TMs pearly gray, turbinates moderately edematous without discharge, post-pharynx non erythematous. Neck: Supple without  lymphadenopathy. Lungs: Clear to auscultation without wheezing, rhonchi or rales. {no increased work of breathing. CV: Normal S1, S2 without murmurs. Abdomen: Nondistended, nontender. Skin: Dry, hyperpigmented, thickened patches on the b/l flanks, small patches on lower ext, small patches on antecubital fossa . Extremities:  No clubbing, cyanosis or edema. Neuro:   Grossly intact.  Diagnositics/Labs: Labs:  Component 10/24/2020     D Pteronyssinus IgE 4.10 Abnormal     D Farinae IgE 6.91 Abnormal     Cat Dander 0.64 Abnormal     Dog Dander IgE 4.95 Abnormal     [place] Grass IgE 6.08 Abnormal     Timothy Grass IgE 9.73 Abnormal  Johnson Grass IgE 2.61 Abnormal     Cockroach German IgE 1.18 Abnormal     Penicillium Chrysogen IgE 0.13 Abnormal     Cladosporium Herbarum (M2) IgE <0.10  Aspergillus FumIgAtus IgE 0.49 Abnormal     Alternaria Alternata IgE 1.78 Abnormal     Maple IgE 3.96 Abnormal     Common Silver Wendee Copp IgE 6.51 Abnormal     [facility] IgE 2.14 Abnormal     Oak White IgE 12.60 Abnormal     Elm American IgE 2.98 Abnormal     [place] 1.88 Abnormal     Pecan Hickory IgE 7.81 Abnormal     White Mulberry  IgE 0.18 Abnormal     Ragweed Short/Common IgE 4.17 Abnormal      Pigweed Rough IgE Class IV kU/L 5.38 Abnormal     Sheep Sorrel IgE Class IV kU/L 4.54 Abnormal     Mouse Urine IgE  Class 0 kU/L <0.10      Egg White IgE Class 0 kU/L <0.10   Peanut Class III kU/L 3.01 Abnormal    Soybean IgE Class I kU/L 0.52 Abnormal    Milk IgE Class 0 kU/L <0.10   Clam IgE Class 0 kU/L <0.10   Shrimp IgE Class II kU/L 0.62 Abnormal    Walnut IgE Class I kU/L 0.55 Abnormal    Codfish IgE Class 0 kU/L <0.10   Scallop IgE Class 0 kU/L <0.10   Wheat Class II kU/L 1.22 Abnormal    Corn IgE Class II kU/L 0.64 Abnormal    Sesame Seed IgE Class III kU/L 2.03 Abnormal     Total IgE 122  Labs from 12/29/20: ANA negative CRP 2 ESR 5 RA quant  <10  Spirometry: FEV1: 2.23L 74%, FVC: 3.15L 88% predicted.  Status post albuterol she had an 13% increase in FEV1 to 2.52 L or 83% which normalized and is significant.  Allergy testing: Select food allergy skin prick testing is positive to green pea. Allergy testing results were read and interpreted by provider, documented by clinical staff.   Assessment and plan:   Allergic rhinitis with conjunctivitis -environmental allergy testing via blood work showed positive results to dust mites, cat dander, dog dander, cockroach, molds, grass pollen, tree pollen, weed pollen. -allergen avoidance measures discussed/handouts provided -try Ryaltris nasal spray.  This contains nasal steroid mometasone (for nasal congestion control) and nasal antihistamine olopatadine (for nasal drainage control).  Use 2 sprays each nostril twice a day as needed for congestion or drainage -stop Xyzal as not providing any benefit -try to avoid benadryl as much as possible -continue Hydroxyzine as directed for anxiety.  This is an antihistamine that is longer acting than Benadryl would just use Hydroxyzine as your antihistamine -for itchy/watery eyes use Pataday 1 drop each eye daily as needed -allergen immunotherapy discussed today including protocol, benefits and risk.  Informational handout provided.  If interested in this therapuetic option you can check with your insurance carrier for coverage.  Let us know if you would like to proceed with this option.    Food allergy/adverse food reaction - we have discussed the following in regards to foods:   Allergy: food allergy is when you have eaten a food, developed an allergic reaction after eating the food and have IgE to the food (positive food testing either by skin testing or blood testing).  Food allergy could lead to life threatening symptoms  Sensitivity: occurs when you have IgE to a food (positive food testing either  by skin testing or blood testing) but is a food you  eat without any issues.  This is not an allergy and we recommend keeping the food in the diet  Intolerance: this is when you have negative testing by either skin testing or blood testing thus not allergic but the food causes symptoms (like belly pain, bloating, diarrhea etc) with ingestion.  These foods should be avoided to prevent symptoms.     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   Negative to tree nuts (not including walnut), fruits and other vegetables.   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  The oral allergy syndrome (OAS) or pollen-food allergy syndrome (PFAS) is a relatively common form of food allergy, particularly in adults. It typically occurs in people who have pollen allergies when the immune system "sees" proteins on the food that look like proteins on the pollen. This results in the allergy antibody (IgE) binding to the food instead of the pollen. Patients typically report itching and/or mild swelling of the mouth and throat immediately following ingestion of certain uncooked fruits (including nuts) or raw vegetables. Only a very small number of affected individuals experience systemic allergic reactions, such as anaphylaxis which occurs with true food allergies.    Asthma -have access to albuterol inhaler 2 puffs or albuterol 1 vial via nebulizer every 4-6 hours as needed for cough/wheeze/shortness of breath/chest tightness.  May use 15-20 minutes prior to activity.   Monitor frequency of use.   -start ArmonAir 1165m 1 puff twice a day.  This is maintenance inhaler that replaces daily use of albuterol for prevention of  symptoms  Asthma control goals:  Full participation in all desired activities (may need albuterol before activity) Albuterol use two time or less a week on average (not counting use with activity) Cough interfering with sleep two time or less a month Oral steroids no more than once a year No hospitalizations  Eczema -bathe and soak for 5-10 minutes in warm water once a day. Pat dry.  Immediately apply the below ointment prescribed to flared areas only (dry, itchy, irritated, scaly, flaky, bumpy, discolored). Wait several minutes and then apply thick moisturizer like Eucerin, Lubriderm, Cetaphil, Vaseline, Aquaphor all over.   To affected areas use non-steroidal ointment: Elidel twice a day as needed. With ointments be careful to avoid the armpits and groin area. -make a note of any foods that make eczema worse. -keep finger nails trimmed. -Dupixent would be a good add-on therapy for eczema control as you have not had good control with use of topical steroids (like hydrocortisone, triamcinolone, mometasone, clobetasol).  Dupixent injection discussed today and information brochure provided.  Let usKoreanow if this is a therapy you like to proceed with  Follow-up in 4 months or sooner if needed  I appreciate the opportunity to take part in [place]. Please do not hesitate to contact me with questions.  Sincerely,   ShPrudy FeelerMD Allergy/Immunology [facility] [place]

## 2021-02-08 NOTE — Patient Instructions (Addendum)
Allergic rhinitis with conjunctivitis -environmental allergy testing via blood work showed positive results to dust mites, cat dander, dog dander, cockroach, molds, grass pollen, tree pollen, weed pollen. -allergen avoidance measures discussed/handouts provided -try Ryaltris nasal spray.  This contains nasal steroid mometasone (for nasal congestion control) and nasal antihistamine olopatadine (for nasal drainage control).  Use 2 sprays each nostril twice a day as needed for congestion or drainage -stop Xyzal as not providing any benefit -try to avoid benadryl as much as possible -continue Hydroxyzine as directed for anxiety.  This is an antihistamine that is longer acting than Benadryl would just use Hydroxyzine as your antihistamine -for itchy/watery eyes use Pataday 1 drop each eye daily as needed -allergen immunotherapy discussed today including protocol, benefits and risk.  Informational handout provided.  If interested in this therapuetic option you can check with your insurance carrier for coverage.  Let us know if you would like to proceed with this option.    Food allergy/adverse food reaction - we have discussed the following in regards to foods:   Allergy: food allergy is when you have eaten a food, developed an allergic reaction after eating the food and have IgE to the food (positive food testing either by skin testing or blood testing).  Food allergy could lead to life threatening symptoms  Sensitivity: occurs when you have IgE to a food (positive food testing either by skin testing or blood testing) but is a food you eat without any issues.  This is not an allergy and we recommend keeping the food in the diet  Intolerance: this is when you have negative testing by either skin testing or blood testing thus not allergic but the food causes symptoms (like belly pain, bloating, diarrhea etc) with ingestion.  These foods should be avoided to prevent symptoms.     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   Negative to tree nuts (not including walnut), fruits and other vegetables.   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  The oral allergy syndrome (OAS) or pollen-food allergy syndrome (PFAS) is a relatively common form of food allergy, particularly in adults. It typically occurs in people who have pollen allergies when the immune system "sees" proteins on the food that look like proteins on the pollen. This results in the allergy antibody (IgE) binding to the food instead of the pollen. Patients typically report itching and/or mild swelling of the mouth and throat immediately following ingestion of certain uncooked fruits (including nuts) or raw vegetables. Only a very small number of affected individuals experience systemic allergic reactions, such as anaphylaxis which occurs with true food allergies.       Asthma -have access to albuterol inhaler 2 puffs or albuterol 1 vial via nebulizer every 4-6 hours as needed for cough/wheeze/shortness of breath/chest tightness.  May use 15-20 minutes prior to activity.   Monitor frequency of use.   -start ArmonAir 1 puff twice a day.  This is maintenance inhaler that replaces daily use of albuterol for prevention of symptoms  Asthma control goals:  Full participation in all desired activities (may need albuterol before activity) Albuterol use two  time or less a week on average (not counting use with activity) Cough interfering with sleep two time or less a month Oral steroids no more than once a year No hospitalizations  Eczema -bathe and soak for 5-10 minutes in warm  water once a day. Pat dry.  Immediately apply the below ointment prescribed to flared areas only (dry, itchy, irritated, scaly, flaky, bumpy, discolored). Wait several minutes and then apply thick moisturizer like Eucerin, Lubriderm, Cetaphil, Vaseline, Aquaphor all over.   To affected areas use non-steroidal ointment: Elidel twice a day as needed. With ointments be careful to avoid the armpits and groin area. -make a note of any foods that make eczema worse. -keep finger nails trimmed. -Dupixent would be a good add-on therapy for eczema control as you have not had good control with use of topical steroids (like hydrocortisone, triamcinolone, mometasone, clobetasol).  Dupixent injection discussed today and information brochure provided.  Let us know if this is a therapy you like to proceed with  Follow-up in 4 months or sooner if needed
